# Patient Record
Sex: Male | Born: 1960 | Race: Black or African American | Hispanic: No | Smoking: Never smoker
Health system: Southern US, Community
[De-identification: ages and names within clinical notes are randomized; demographics above are authoritative.]

## PROBLEM LIST (undated history)

## (undated) DIAGNOSIS — N529 Male erectile dysfunction, unspecified: Secondary | ICD-10-CM

## (undated) DIAGNOSIS — Z800 Family history of malignant neoplasm of digestive organs: Secondary | ICD-10-CM

## (undated) DIAGNOSIS — K263 Acute duodenal ulcer without hemorrhage or perforation: Secondary | ICD-10-CM

## (undated) DIAGNOSIS — B9681 Helicobacter pylori [H. pylori] as the cause of diseases classified elsewhere: Secondary | ICD-10-CM

## (undated) DIAGNOSIS — K279 Peptic ulcer, site unspecified, unspecified as acute or chronic, without hemorrhage or perforation: Secondary | ICD-10-CM

## (undated) HISTORY — DX: Male erectile dysfunction, unspecified: N52.9

## (undated) HISTORY — DX: Helicobacter pylori (H. pylori) as the cause of diseases classified elsewhere: K27.9

## (undated) HISTORY — DX: Acute duodenal ulcer without hemorrhage or perforation: K26.3

## (undated) HISTORY — DX: Family history of malignant neoplasm of digestive organs: Z80.0

## (undated) HISTORY — PX: KNEE SURGERY: SHX244

## (undated) HISTORY — PX: ACHILLES TENDON REPAIR: SUR1153

## (undated) HISTORY — PX: ROTATOR CUFF REPAIR: SHX139

## (undated) HISTORY — DX: Helicobacter pylori (H. pylori) as the cause of diseases classified elsewhere: B96.81

---

## 2002-04-07 ENCOUNTER — Inpatient Hospital Stay (HOSPITAL_COMMUNITY): Admission: EM | Admit: 2002-04-07 | Discharge: 2002-04-08 | Payer: Self-pay | Admitting: Emergency Medicine

## 2002-04-17 ENCOUNTER — Encounter: Admission: RE | Admit: 2002-04-17 | Discharge: 2002-04-17 | Payer: Self-pay | Admitting: Urology

## 2002-04-17 ENCOUNTER — Encounter: Payer: Self-pay | Admitting: Urology

## 2007-02-20 ENCOUNTER — Ambulatory Visit: Payer: Self-pay | Admitting: Gastroenterology

## 2007-02-20 LAB — CONVERTED CEMR LAB
ALT: 17 units/L (ref 0–53)
AST: 27 units/L (ref 0–37)
Albumin: 4.1 g/dL (ref 3.5–5.2)
Alkaline Phosphatase: 84 units/L (ref 39–117)
Amylase: 121 units/L (ref 27–131)
BUN: 6 mg/dL (ref 6–23)
Basophils Absolute: 0 10*3/uL (ref 0.0–0.1)
Basophils Relative: 0.5 % (ref 0.0–1.0)
Bilirubin, Direct: 0.4 mg/dL — ABNORMAL HIGH (ref 0.0–0.3)
CO2: 33 meq/L — ABNORMAL HIGH (ref 19–32)
Calcium: 9.4 mg/dL (ref 8.4–10.5)
Chloride: 99 meq/L (ref 96–112)
Creatinine, Ser: 1.1 mg/dL (ref 0.4–1.5)
Eosinophils Absolute: 0.3 10*3/uL (ref 0.0–0.6)
Eosinophils Relative: 4.5 % (ref 0.0–5.0)
Ferritin: 151.1 ng/mL (ref 22.0–322.0)
Folate: 4.4 ng/mL
GFR calc Af Amer: 92 mL/min
GFR calc non Af Amer: 76 mL/min
Glucose, Bld: 95 mg/dL (ref 70–99)
HCT: 44.6 % (ref 39.0–52.0)
Hemoglobin: 15.3 g/dL (ref 13.0–17.0)
Iron: 107 ug/dL (ref 42–165)
Lipase: 17 units/L (ref 11.0–59.0)
Lymphocytes Relative: 43.8 % (ref 12.0–46.0)
MCHC: 34.3 g/dL (ref 30.0–36.0)
MCV: 97.2 fL (ref 78.0–100.0)
Monocytes Absolute: 0.4 10*3/uL (ref 0.2–0.7)
Monocytes Relative: 7.1 % (ref 3.0–11.0)
Neutro Abs: 2.5 10*3/uL (ref 1.4–7.7)
Neutrophils Relative %: 44.1 % (ref 43.0–77.0)
PSA: 0.68 ng/mL (ref 0.10–4.00)
Platelets: 325 10*3/uL (ref 150–400)
Potassium: 4.1 meq/L (ref 3.5–5.1)
RBC: 4.58 M/uL (ref 4.22–5.81)
RDW: 11.1 % — ABNORMAL LOW (ref 11.5–14.6)
Saturation Ratios: 37.7 % (ref 20.0–50.0)
Sed Rate: 14 mm/hr (ref 0–20)
Sodium: 138 meq/L (ref 135–145)
TSH: 1.05 microintl units/mL (ref 0.35–5.50)
Total Bilirubin: 3.1 mg/dL — ABNORMAL HIGH (ref 0.3–1.2)
Total Protein: 7.4 g/dL (ref 6.0–8.3)
Transferrin: 202.5 mg/dL — ABNORMAL LOW (ref >=212.0)
Vitamin B-12: 278 pg/mL (ref 211–911)
WBC: 5.6 10*3/uL (ref 4.5–10.5)

## 2007-02-22 ENCOUNTER — Ambulatory Visit (HOSPITAL_COMMUNITY): Admission: RE | Admit: 2007-02-22 | Discharge: 2007-02-22 | Payer: Self-pay | Admitting: Gastroenterology

## 2007-02-27 ENCOUNTER — Ambulatory Visit: Payer: Self-pay | Admitting: Gastroenterology

## 2007-03-05 ENCOUNTER — Ambulatory Visit: Payer: Self-pay | Admitting: Gastroenterology

## 2007-10-25 ENCOUNTER — Telehealth: Payer: Self-pay | Admitting: Gastroenterology

## 2007-10-25 ENCOUNTER — Ambulatory Visit: Payer: Self-pay | Admitting: Gastroenterology

## 2007-10-25 DIAGNOSIS — R1013 Epigastric pain: Secondary | ICD-10-CM | POA: Insufficient documentation

## 2007-10-25 DIAGNOSIS — R1032 Left lower quadrant pain: Secondary | ICD-10-CM | POA: Insufficient documentation

## 2007-10-26 ENCOUNTER — Ambulatory Visit: Payer: Self-pay | Admitting: Gastroenterology

## 2007-10-26 ENCOUNTER — Encounter: Payer: Self-pay | Admitting: Gastroenterology

## 2007-10-30 ENCOUNTER — Encounter: Payer: Self-pay | Admitting: Gastroenterology

## 2009-03-25 IMAGING — US US ABDOMEN COMPLETE
1 series · 14 of 25 positions shown · non-contrast
Comparison: None

CLINICAL DATA: Abdominal pain

ABDOMEN ULTRASOUND
TECHNIQUE: Complete abdominal ultrasound examination was performed including
evaluation of the liver, gallbladder, bile ducts, pancreas, kidneys, spleen,
IVC, and abdominal aorta.

[Series 1: abdomen · 0.30mm/px · 14 of 57 slices shown]
[im 1/57]
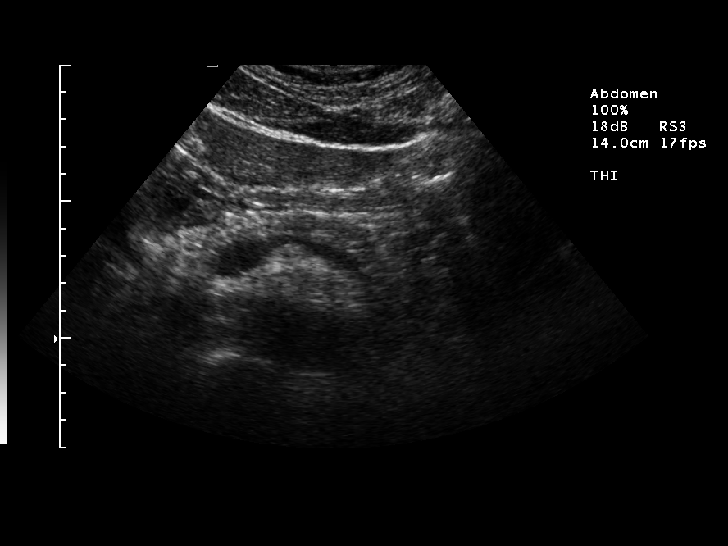
[im 5/57]
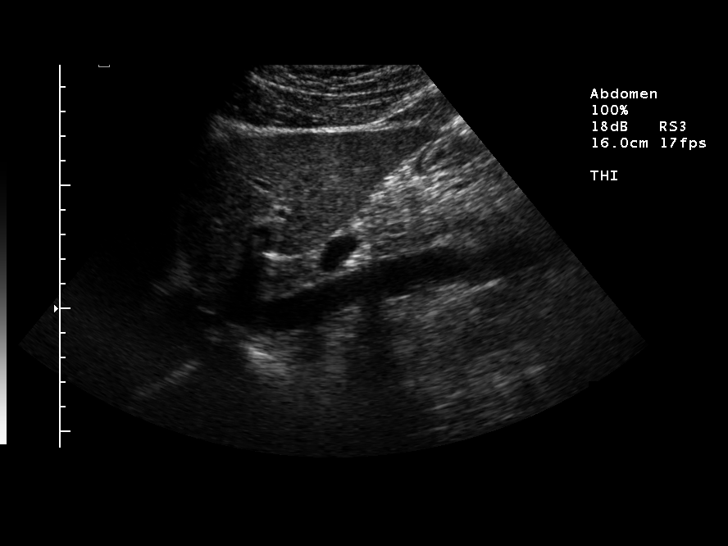
[im 10/57]
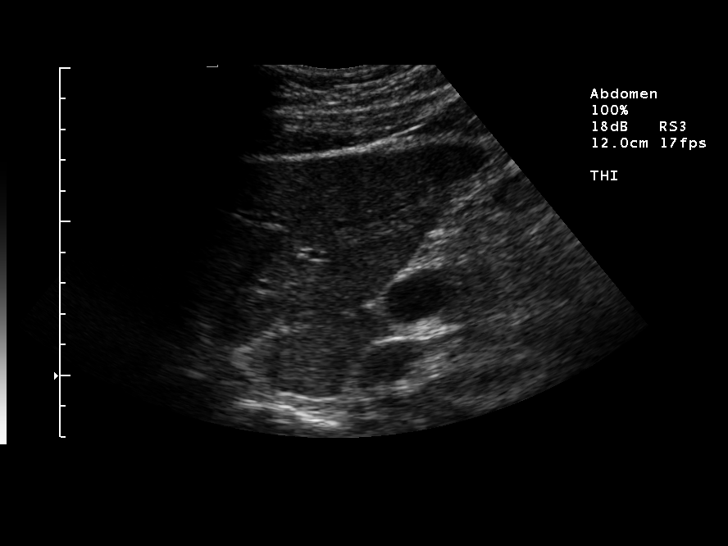
[im 15/57]
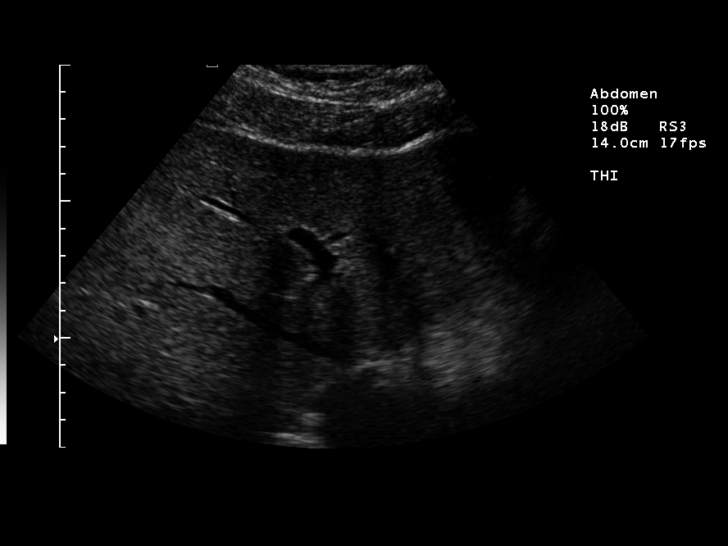
[im 19/57]
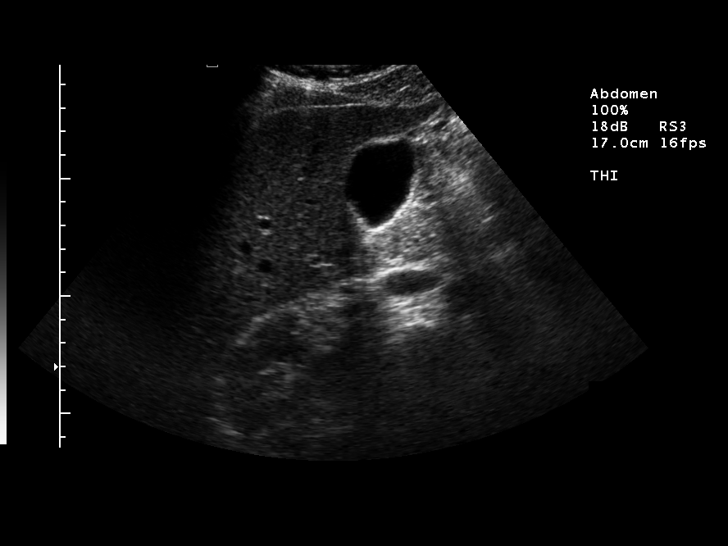
[im 22/57]
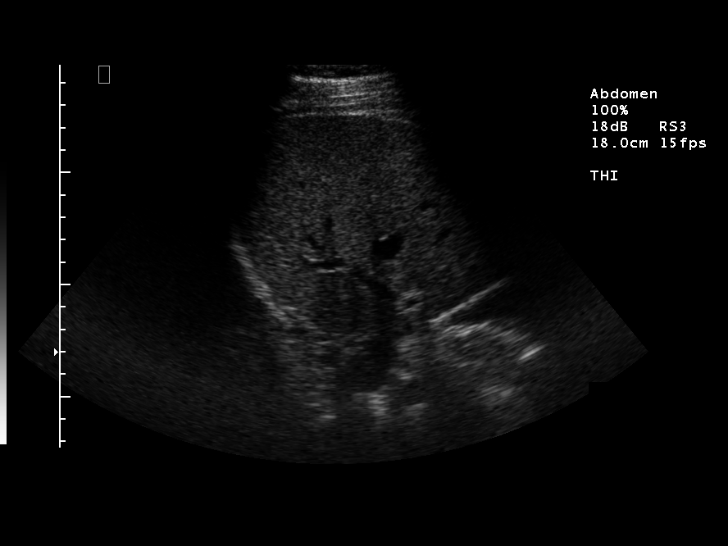
[im 26/57]
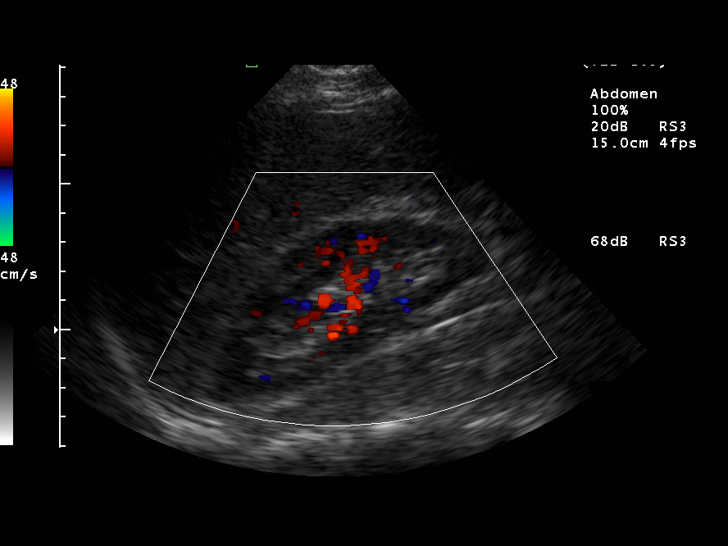
[im 31/57]
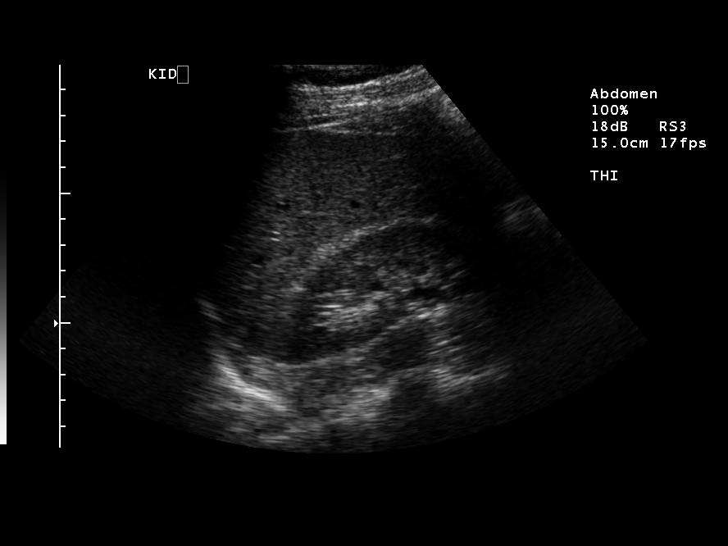
[im 36/57]
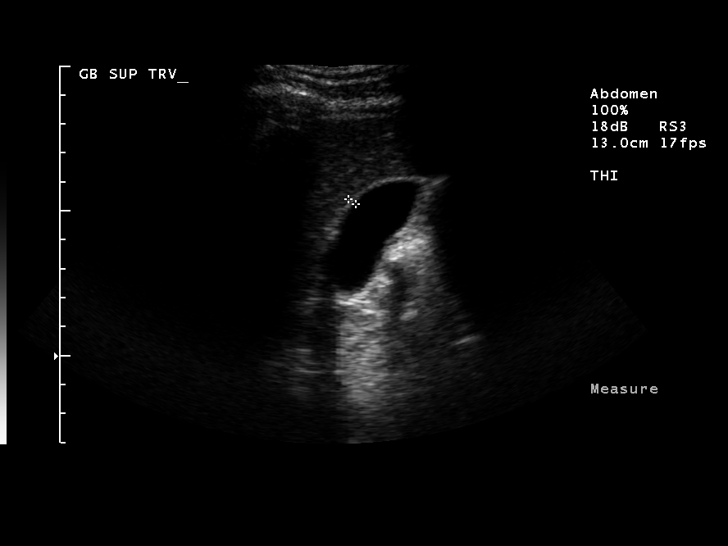
[im 38/57]
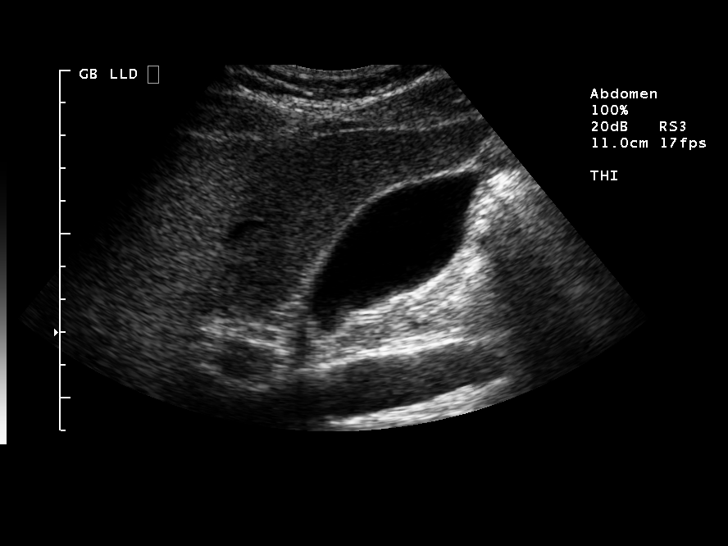
[im 43/57]
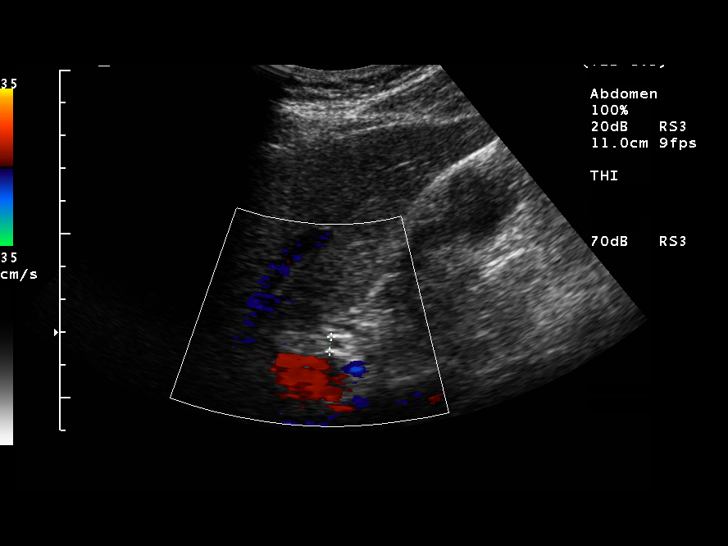
[im 47/57]
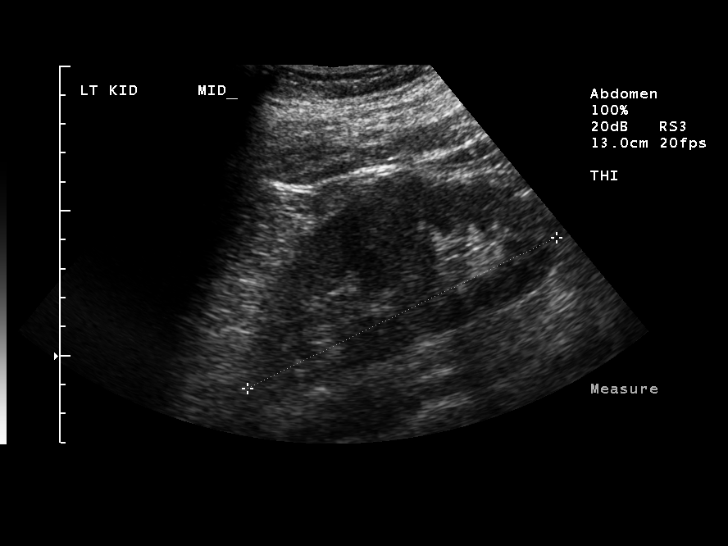
[im 52/57]
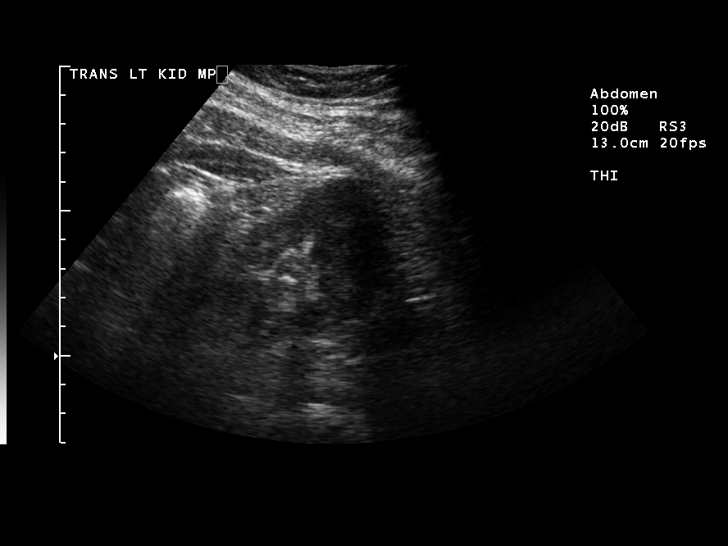
[im 57/57]
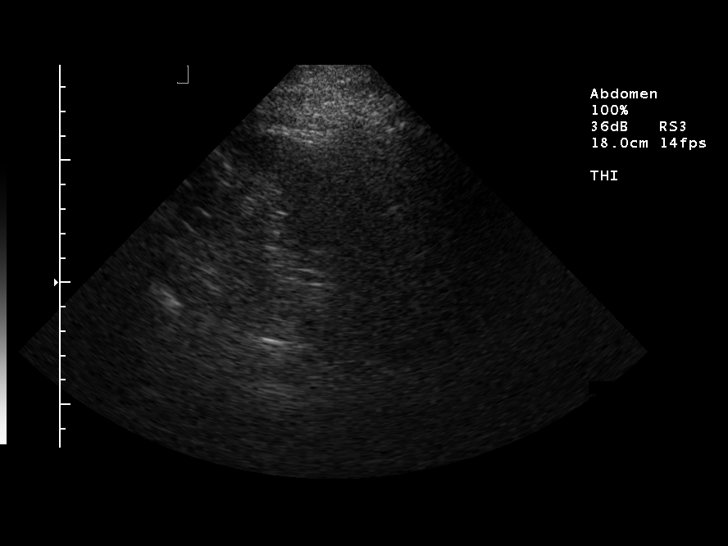

[14 of 25 positions shown; findings below may reference images not displayed]

FINDINGS: Normal gallbladder without wall thickening, stone, or pericholecystic
fluid. Common duct normal at 5 mm.

Liver, IVC, and pancreas within normal limits.

Limited evaluation of the spleen, grossly normal.

Right kidney 10.9 cm. Left kidney 11.3. No hydronephrosis.

Abdominal aorta non-aneurysmal without ascites.

IMPRESSION

1. No explanation for abdominal pain.

## 2010-07-27 NOTE — Assessment & Plan Note (Signed)
[facility]                         GASTROENTEROLOGY OFFICE NOTE   ASHWATH, LASCH                    MRN:          [id number]  DATE:02/20/2007                            DOB:          06-18-1960    [name] is a 50 year old African-American male who is self-  referred for evaluation of abdominal pain.   [name] has been in excellent health all of his life, except for  some problems post vasectomy in January of 2004 where he developed a  small scrotal abscess.  At that time, he did have CT scan of the abdomen  performed, which was unremarkable, including imaging of the gallbladder.  He has really been asymptomatic since that time, but about 6 or 7 months  ago, had an episode of right mid quadrant pain, which had lasted  approximately a week, described as a dull aching sensation.  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  His milk intake resulted in some diarrhea and  apparently resolution of his pain, which did not bother him again until  the last week when he, this time, has had left lower quadrant discomfort  without any other real symptomatology or alleviating or precipitating  elements.  He has continued to have fairly regular bowel movements and  denies melena or hematochezia.  He denies any upper GI symptoms, such as  acid reflux, dysphagia, anorexia, or weight loss.  He follows a regular  diet.  Denies any specific food intolerance, except to lactose.  He has  had no fever, chills, skin rash, joint pains, oral stomatitis, et  Karie Soda.  He denies abuse of alcohol, cigarettes, or NSAID.  He has never  had to have endoscopic or barium studies of his bowels.   PAST MEDICAL HISTORY:  Otherwise, noncontributory.   MEDICATIONS:  None.   ALLERGIES:  None.   FAMILY HISTORY:  Remarkable for an uncle with diabetes, but no known  gastrointestinal problems.   SOCIAL HISTORY:   The patient is widowed and lives by himself.  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  He does not smoke or use ethanol.   REVIEW OF SYSTEMS:  Entirely noncontributory without any current  genitourinary, neurological, orthopedic, cardiovascular, or psychiatric  difficulties.   EXAMINATION:  He is a very healthy-appearing black male appearing his  stated age, in no acute distress.  He is 6 feet 4 inches and weighs 220 pounds.  Blood pressure 124/80 and  pulse 72 and regular.  He did have what appeared to be mild scleral icterus, but there was no  other stigmata of chronic liver disease.  I could not appreciate thyromegaly or lymphadenopathy.  His chest was clear to percussion and auscultation.  He was in a regular rhythm without murmur, gallop, or rub.  There was no hepatosplenomegaly, abdominal masses, or distension.  There  is some slight tenderness to deep palpation over the sigmoid colon.  Bowel sounds were normal.  Peripheral extremities were unremarkable.  RECTAL:  Showed slight enlargement of  the prostate, but no nodules.  There was soft stool present.  Guaiac negative.  Mental status was clear.   ASSESSMENT:  1. Intermittent lower abdominal pain of unexplained etiology in a      patient with known lactose intolerance.  It certainly does not seem      like he has an acute inflammatory condition at this time, and he      does not have classic symptoms for irritable bowel syndrome.  2. Scleral icterus - rule out chronic liver disease or low grade      hemolysis.  The patient relates that he has had doctors in the past      tell him that his eyes were yellow and he has apparently had      extensive laboratory testing, the results of which are unclear at      this time.  It is certainly possible that he could have chronic      hemolysis and cholelithiasis with the passage of small gall stones.  3. Status post vasectomy.    RECOMMENDATIONS:  1. Screening laboratory parameters, including CBC, metabolic profile,      liver function tests.  2. Upper abdominal ultrasound exam.  3. P.r.n. sublingual Levsin until we can see him in followup.  4. Lactaid tablets on a p.r.n. basis.  5. GI followup in 2 to 3 weeks' time for review of his ultrasound, lab      test, and clinical course.  He may need endoscopic exam of the      upper and lower GI tract depending on his clinica course and      workup.     Vania Rea. Jarold Motto, MD, Caleen Essex, FAGA  Electronically Signed    DRP/MedQ  DD: 02/20/2007  DT: 02/20/2007  Job #: [id number]

## 2010-07-27 NOTE — Assessment & Plan Note (Signed)
Eduardo Cruz                         GASTROENTEROLOGY OFFICE NOTE   ROHEN, Eduardo Cruz                    MRN:          [id number]  DATE:02/27/2007                            DOB:          07/06/1960    Eduardo Cruz continues to have episodes of left lower quadrant spasmodic-type  pain without real precipitating or alleviating elements.  On close  questioning, it does seem that his pain is precipitated occasionally by  over-eating.  He tries to go to the bathroom but has had no real  diarrhea and has fairly regular bowel movements without melena or  hematochezia.  He denies any systemic complaints such as fever or  chills, and he also denies any genitourinary problems.  His appetite  remains good, and his weight is stable.   As part of his work-up lab data on December 9, showed a normal CBC and  metabolic profile, amylase, lipase, thyroid functions, and anemia  workup.  He does have an elevated indirect bilirubin of 2.7 mg per  sample with a direct of 0.4 and a total of 3.1 mg%.  Liver function  tests were otherwise normal.  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   Blood pressure is 126/84, and pulse was 72 and regular.  I did not  repeat general physical exam at this time.   ASSESSMENT:  Eduardo Cruz continues to have spasmodic-type pain in his  left lower quadrant.  I suspect he has some mild diverticulosis coli and  may have some smoldering subacute diverticulitis.   RECOMMENDATIONS:  1. I have gone ahead and set Eduardo Cruz up for colonoscopy with sedation.      I am going to continue to let him use p.r.n. hyoscyamine until his      workup can be completed.  2. 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  This does not need      further evaluation at this time.     Vania Rea. Jarold Motto, MD, Caleen Essex, FAGA  Electronically Signed    DRP/MedQ  DD:  02/27/2007  DT: 02/27/2007  Job #: [id number]

## 2010-07-30 NOTE — Op Note (Signed)
Eduardo Cruz, Eduardo Cruz NO.:  [id number]   MEDICAL RECORD NO.:  [id number]                   PATIENT TYPE:  OBV   LOCATION:  0468                                 FACILITY:  [facility]   PHYSICIAN:  Valetta Fuller, M.D.               DATE OF BIRTH:  [birth year]   DATE OF PROCEDURE:  04/06/2002  DATE OF DISCHARGE:                                 OPERATIVE REPORT   PREOPERATIVE DIAGNOSES:  1. Scrotal abscess.  2. Scrotal hematoma.  3. The patient status post vasectomy.   POSTOPERATIVE DIAGNOSES:  1. Scrotal abscess.  2. Scrotal hematoma.  3. The patient status post vasectomy.   PROCEDURE PERFORMED:  Incision and drainage of left scrotal vasectomy wound  infection/scrotal abscess.   SURGEON:  Valetta Fuller, M.D.   ANESTHESIA:  General.   INDICATIONS FOR PROCEDURE:  Mr. Carreon is a 50 year old male.  Approximately two weeks ago, he underwent elective vasectomy.  The procedure  at the time was uncomplicated, and the patient tells me that he did  extremely well for the first 2-3 days, having no swelling or significant  discomfort.  The patient then went to the gym and bench pressed some weights  with his son.  After that, he began noticing some increased discomfort and  swelling.  He presented to our office 4-5 days after vasectomy and was noted  to have a moderate to significant left-sided scrotal hematoma.  The right  side was unremarkable.  We did not feel that there was any absolute  indication for intervention and talked to him about the time frame that is  required for resolution of a hematoma.  Because of the propensity for  infection, I decided to put him on some Keflex.  He was seen in follow-up  and was doing somewhat better.  He contacted me earlier today and told me  that he had started to notice obvious purulent drainage from the incision.  His wife is a Designer, jewellery and told me that at least 10 mL of clearly  purulent material had  drained from his wound.  I saw him in the emergency  room and felt that he did have a significant induration and some fluctuance  of his upper scrotum.  There was some increased swelling of the scrotum from  my previous exam.  There was clearly some drainage of purulent material, but  it was difficult to really determine how extensive the actual abscess cavity  was.  He was really afebrile and did not appear toxic in any manner.  We  felt, however, that there was probably not adequate drainage out of the very  tiny vasectomy incision and that at least exploration was required.  A  culture was obtained in the emergency room prior to starting antibiotics,  and he has not gotten a dose of cefotetan.   TECHNIQUE AND FINDINGS:  The patient was brought to  the operating room where  he had successful induction of general anesthesia.  He was prepped and  draped in the usual manner.  I made approximately a 3 cm incision, ellipsing  out the old vasectomy incision where there was some increased edema.  Upon  entering the incision, we obtained about 5 mL of additional purulent  material.  We primarily encountered quite a bit of indurated tissue.  There  was a small cavity but not a really extensive abscess.  There was also  considerable fullness surrounding the testicle.  It was hard to know whether  this represented actual hematoma, possible continuation of the infection, or  reactive hydrocele.  For that reason, I made a very small nick incision in  the tunica vaginalis underneath some fibrinoid and inflammatory tissue.  Upon entering the tunica vaginalis, we obtained about 50 mL of clear, straw-  colored fluid, indicating that at least within the tunica vaginalis around  the testicle, there was no infection or obvious hematoma; rather there was  just some reactive hydrocele.  We did not explore the testicle, but palpably  it felt reasonably normal and again, there was no obvious blood or infection   surrounding the testicle itself.  There continued to be marked induration in  the spermatic cord.  There did not appear to be, however, any fluctuance or  undrained purulence.  For that reason, we did not do an extensive dissection  within the cord itself and really kept our dissection very superficial,  opening the compartment.  The entire scrotal area was irrigated copiously  with antibiotic solution.  We did this for about 5-10 minutes.  I did not  feel any other undrained areas.  We then packed the incision with some  Iodoform gauze, and the patient appeared to tolerate the procedure  reasonably well.  We did put a little Marcaine within the wound to help with  postop analgesia.                                               Valetta Fuller, M.D.    DSG/MEDQ  D:  04/06/2002  T:  04/07/2002  Job:  [id number]

## 2012-12-21 ENCOUNTER — Encounter: Payer: Self-pay | Admitting: Gastroenterology

## 2013-01-07 ENCOUNTER — Encounter: Payer: Self-pay | Admitting: *Deleted

## 2013-01-11 ENCOUNTER — Encounter: Payer: Self-pay | Admitting: Gastroenterology

## 2013-01-11 ENCOUNTER — Ambulatory Visit (INDEPENDENT_AMBULATORY_CARE_PROVIDER_SITE_OTHER): Payer: Federal, State, Local not specified - PPO | Admitting: Gastroenterology

## 2013-01-11 VITALS — BP 112/80 | HR 66 | Ht 74.5 in | Wt 231.5 lb

## 2013-01-11 DIAGNOSIS — Z800 Family history of malignant neoplasm of digestive organs: Secondary | ICD-10-CM

## 2013-01-11 MED ORDER — NA SULFATE-K SULFATE-MG SULF 17.5-3.13-1.6 GM/177ML PO SOLN: ORAL | Status: DC

## 2013-01-11 NOTE — Patient Instructions (Signed)

## 2013-01-11 NOTE — Progress Notes (Signed)
History of Present Illness:  This is a  52 year old African American male who has a positive family history of colon cancer in his mother in her early 68s, and also upper tunnel grandfather.  He had colonoscopy in 2008 which was unremarkable but was a poor prep.  He has no GI complaints whatsoever.  Bowel movements are regular without melena or hematochezia, and he denies abdominal pain.  He did have a duodenal ulcer in 2009, had endoscopy, and was treated with PPI therapy.  He denies upper GI or hepatobiliary complaints at this time.  His appetite is good and his weight is stable.  He recently saw his urologist and had a negative rectal exam and normal lab tests that were reviewed today.  I have reviewed this patient's present history, medical and surgical past history, allergies and medications.     ROS:   All systems were reviewed and are negative unless otherwise stated in the HPI.    Physical Exam: Blood pressure 112/80, pulse 66 and regular, and weight 231 with a BMI of 29.33. General well developed well nourished patient in no acute distress, appearing their stated age Eyes PERRLA, no icterus, fundoscopic exam per opthamologist Skin no lesions noted Neck supple, no adenopathy, no thyroid enlargement, no tenderness Chest clear to percussion and auscultation Heart no significant murmurs, gallops or rubs noted Abdomen no hepatosplenomegaly masses or tenderness, BS normal.  Rectal inspection normal no fissures, or fistulae noted.  No masses or tenderness on digital exam. Stool guaiac negative. Extremities no acute joint lesions, edema, phlebitis or evidence of cellulitis. Neurologic patient oriented x 3, cranial nerves intact, no focal neurologic deficits noted. Psychological mental status normal and normal affect.  Assessment and plan: This patient is high risk for colon cancer and I've scheduled him for colonoscopy with a" double prep" exam.  He otherwise appeared to be in good health and  should have no problems with the prep or the examination.

## 2013-01-14 ENCOUNTER — Ambulatory Visit (AMBULATORY_SURGERY_CENTER): Payer: Federal, State, Local not specified - PPO | Admitting: Gastroenterology

## 2013-01-14 ENCOUNTER — Encounter: Payer: Self-pay | Admitting: Gastroenterology

## 2013-01-14 VITALS — BP 123/72 | HR 63 | Temp 95.6°F | Resp 19 | Ht 74.0 in | Wt 231.0 lb

## 2013-01-14 DIAGNOSIS — Z800 Family history of malignant neoplasm of digestive organs: Secondary | ICD-10-CM

## 2013-01-14 MED ORDER — SODIUM CHLORIDE 0.9 % IV SOLN: 500.0000 mL | INTRAVENOUS | Status: DC

## 2013-01-14 NOTE — Progress Notes (Signed)
Patient did not experience any of the following events: a burn prior to discharge; a fall within the facility; wrong site/side/patient/procedure/implant event; or a hospital transfer or hospital admission upon discharge from the facility. (G8907) Patient did not have preoperative order for IV antibiotic SSI prophylaxis. (G8918)  

## 2013-01-14 NOTE — Patient Instructions (Signed)
YOU HAD AN ENDOSCOPIC PROCEDURE TODAY AT [facility]: Refer to the procedure report that was given to you for any specific questions about what was found during the examination.  If the procedure report does not answer your questions, please call your gastroenterologist to clarify.  If you requested that your care partner not be given the details of your procedure findings, then the procedure report has been included in a sealed envelope for you to review at your convenience later.  YOU SHOULD EXPECT: Some feelings of bloating in the abdomen. Passage of more gas than usual.  Walking can help get rid of the air that was put into your GI tract during the procedure and reduce the bloating. If you had a lower endoscopy (such as a colonoscopy or flexible sigmoidoscopy) you may notice spotting of blood in your stool or on the toilet paper. If you underwent a bowel prep for your procedure, then you may not have a normal bowel movement for a few days.  DIET: Your first meal following the procedure should be a light meal and then it is ok to progress to your normal diet.  A half-sandwich or bowl of soup is an example of a good first meal.  Heavy or fried foods are harder to digest and may make you feel nauseous or bloated.  Likewise meals heavy in dairy and vegetables can cause extra gas to form and this can also increase the bloating.  Drink plenty of fluids but you should avoid alcoholic beverages for 24 hours.  ACTIVITY: Your care partner should take you home directly after the procedure.  You should plan to take it easy, moving slowly for the rest of the day.  You can resume normal activity the day after the procedure however you should NOT DRIVE or use heavy machinery for 24 hours (because of the sedation medicines used during the test).    SYMPTOMS TO REPORT IMMEDIATELY: A gastroenterologist can be reached at any hour.  During normal business hours, 8:30 AM to 5:00 PM Monday through Friday,  call [phone].  After hours and on weekends, please call the GI answering service at [phone] who will take a message and have the physician on call contact you.   Following lower endoscopy (colonoscopy or flexible sigmoidoscopy):  Excessive amounts of blood in the stool  Significant tenderness or worsening of abdominal pains  Swelling of the abdomen that is new, acute  Fever of 100F or higher  F  FOLLOW UP: If any biopsies were taken you will be contacted by phone or by letter within the next 1-3 weeks.  Call your gastroenterologist if you have not heard about the biopsies in 3 weeks.  Our staff will call the home number listed on your records the next business day following your procedure to check on you and address any questions or concerns that you may have at that time regarding the information given to you following your procedure. This is a courtesy call and so if there is no answer at the home number and we have not heard from you through the emergency physician on call, we will assume that you have returned to your regular daily activities without incident.  SIGNATURES/CONFIDENTIALITY: You and/or your care partner have signed paperwork which will be entered into your electronic medical record.  These signatures attest to the fact that that the information above on your After Visit Summary has been reviewed and is understood.  Full responsibility of the confidentiality  of this discharge information lies with you and/or your care-partner.  Normal colonscopy.  Repeat in 5 years-2019

## 2013-01-14 NOTE — Op Note (Signed)
[facility] [address] [place], [address]   COLONOSCOPY PROCEDURE REPORT  PATIENT: Eduardo Cruz  MR#: [id number] BIRTHDATE: Jun 27, 1960 , 52  yrs. old GENDER: Male ENDOSCOPIST: Mardella Layman, MD, [facility] REFERRED BY: PROCEDURE DATE:  01/14/2013 PROCEDURE:   Colonoscopy, surveillance First Screening Colonoscopy - Avg.  risk and is 50 yrs.  old or older - No.      History of Adenoma - Now for follow-up colonoscopy & has been > or = to 3 yrs.  N/A ASA CLASS:   Class II INDICATIONS:Patient's immediate family history of colon cancer. MEDICATIONS: propofol (Diprivan) 250mg  IV  DESCRIPTION OF PROCEDURE:   After the risks benefits and alternatives of the procedure were thoroughly explained, informed consent was obtained.  A digital rectal exam revealed no abnormalities of the rectum.   The [id number]  endoscope was introduced through the anus and advanced to the cecum, which was identified by both the appendix and ileocecal valve. No adverse events experienced.   The quality of the prep was good, using MoviPrep  The instrument was then slowly withdrawn as the colon was fully examined.      COLON FINDINGS: A normal appearing cecum, ileocecal valve, and appendiceal orifice were identified.  The ascending, hepatic flexure, transverse, splenic flexure, descending, sigmoid colon and rectum appeared unremarkable.  No polyps or cancers were seen. Retroflexed views revealed no abnormalities. The time to cecum=3 minutes 39 seconds.  Withdrawal time=8 minutes 2 seconds.  The scope was withdrawn and the procedure completed. COMPLICATIONS: There were no complications.  ENDOSCOPIC IMPRESSION: Normal colon...no polyps noted...++ FH colon cancer.  RECOMMENDATIONS: 1.  Continue current medications 2.  Repeat Colonoscopy in 5 years.   eSigned:  Mardella Layman, MD, [facility] 01/14/2013 2:24 PM   cc: Morton Stall, MD   PATIENT NAME:  Eduardo Cruz, Eduardo Cruz MR#: [id number]

## 2013-01-15 ENCOUNTER — Telehealth: Payer: Self-pay

## 2013-01-15 NOTE — Telephone Encounter (Signed)
No answer, left message

## 2018-03-18 ENCOUNTER — Encounter: Payer: Self-pay | Admitting: Gastroenterology

## 2022-09-09 ENCOUNTER — Telehealth (HOSPITAL_BASED_OUTPATIENT_CLINIC_OR_DEPARTMENT_OTHER): Payer: Self-pay | Admitting: *Deleted

## 2022-09-09 NOTE — Telephone Encounter (Signed)
Per Dr Leonides Sake request, patient needs new patient appointment  Left message to call back week after next and will get him scheduled on one of the days Dr Duke Salvia has approved getting him in

## 2022-10-11 NOTE — Telephone Encounter (Signed)
Left message to call back  

## 2022-11-03 NOTE — Telephone Encounter (Signed)
Left message to call back if still interested in getting an appointment
